# Patient Record
Sex: Male | Born: 1960 | Race: White | Hispanic: No | Marital: Married | Smoking: Never smoker
Health system: Southern US, Community
[De-identification: ages and names within clinical notes are randomized; demographics above are authoritative.]

---

## 2015-07-29 ENCOUNTER — Emergency Department (HOSPITAL_BASED_OUTPATIENT_CLINIC_OR_DEPARTMENT_OTHER): Payer: Self-pay

## 2015-07-29 ENCOUNTER — Emergency Department (HOSPITAL_BASED_OUTPATIENT_CLINIC_OR_DEPARTMENT_OTHER)
Admission: EM | Admit: 2015-07-29 | Discharge: 2015-07-29 | Disposition: A | Discharge status: Home / Self Care | Payer: Self-pay | Attending: Emergency Medicine | Admitting: Emergency Medicine

## 2015-07-29 ENCOUNTER — Encounter (HOSPITAL_BASED_OUTPATIENT_CLINIC_OR_DEPARTMENT_OTHER): Payer: Self-pay | Admitting: *Deleted

## 2015-07-29 DIAGNOSIS — S6992XA Unspecified injury of left wrist, hand and finger(s), initial encounter: Secondary | ICD-10-CM | POA: Insufficient documentation

## 2015-07-29 DIAGNOSIS — Y9289 Other specified places as the place of occurrence of the external cause: Secondary | ICD-10-CM | POA: Insufficient documentation

## 2015-07-29 DIAGNOSIS — Y998 Other external cause status: Secondary | ICD-10-CM | POA: Insufficient documentation

## 2015-07-29 DIAGNOSIS — W228XXA Striking against or struck by other objects, initial encounter: Secondary | ICD-10-CM | POA: Insufficient documentation

## 2015-07-29 DIAGNOSIS — Y9389 Activity, other specified: Secondary | ICD-10-CM | POA: Insufficient documentation

## 2015-07-29 NOTE — ED Notes (Signed)
Pt reports that he injured his left ring finger yesterday.  Swelling and bruising noted.  Pt here to have his ring removed.

## 2015-07-29 NOTE — Discharge Instructions (Signed)
Please use attached information for symptomatic care. Please follow-up with primary care provider if symptoms continue to persist or worsen.
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
## 2016-08-27 IMAGING — CR DG FINGER RING 2+V*L*
3 series · 3 of 3 positions shown · non-contrast
Comparison: None.

CLINICAL DATA: Injury yesterday

EXAM:
LEFT RING FINGER 2+V

[x finger pa left]
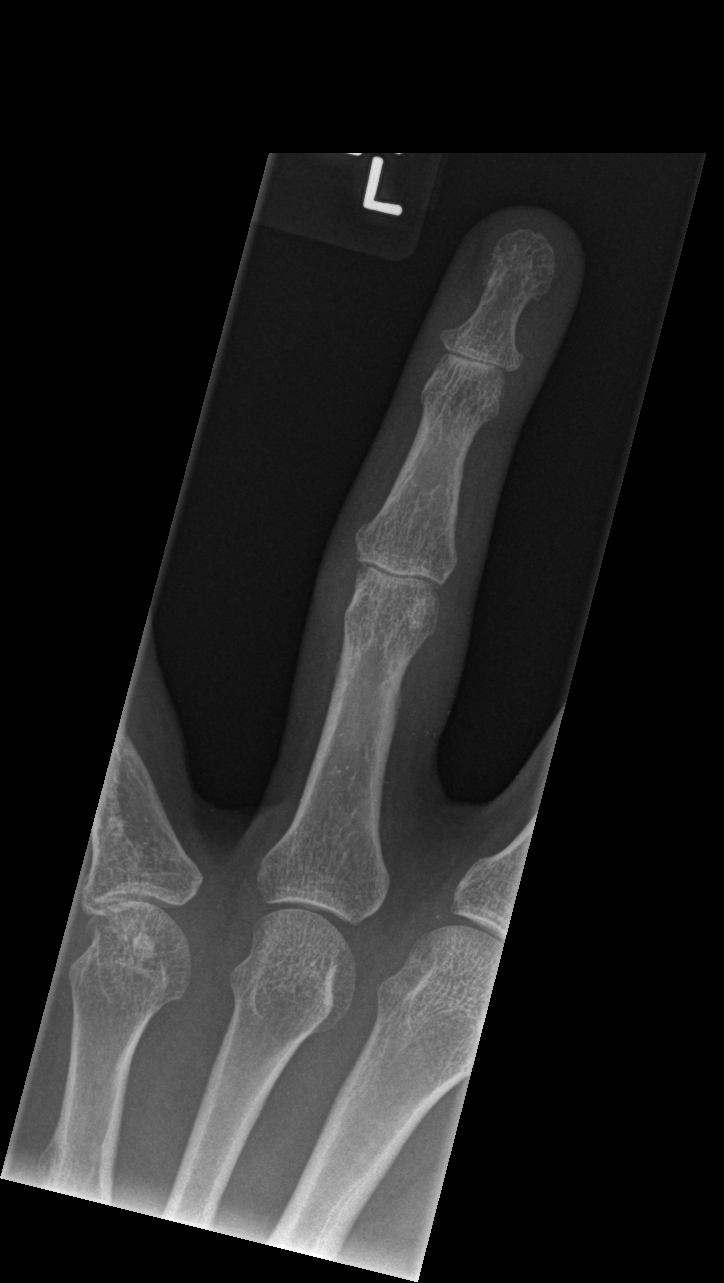

[x finger obl. left]
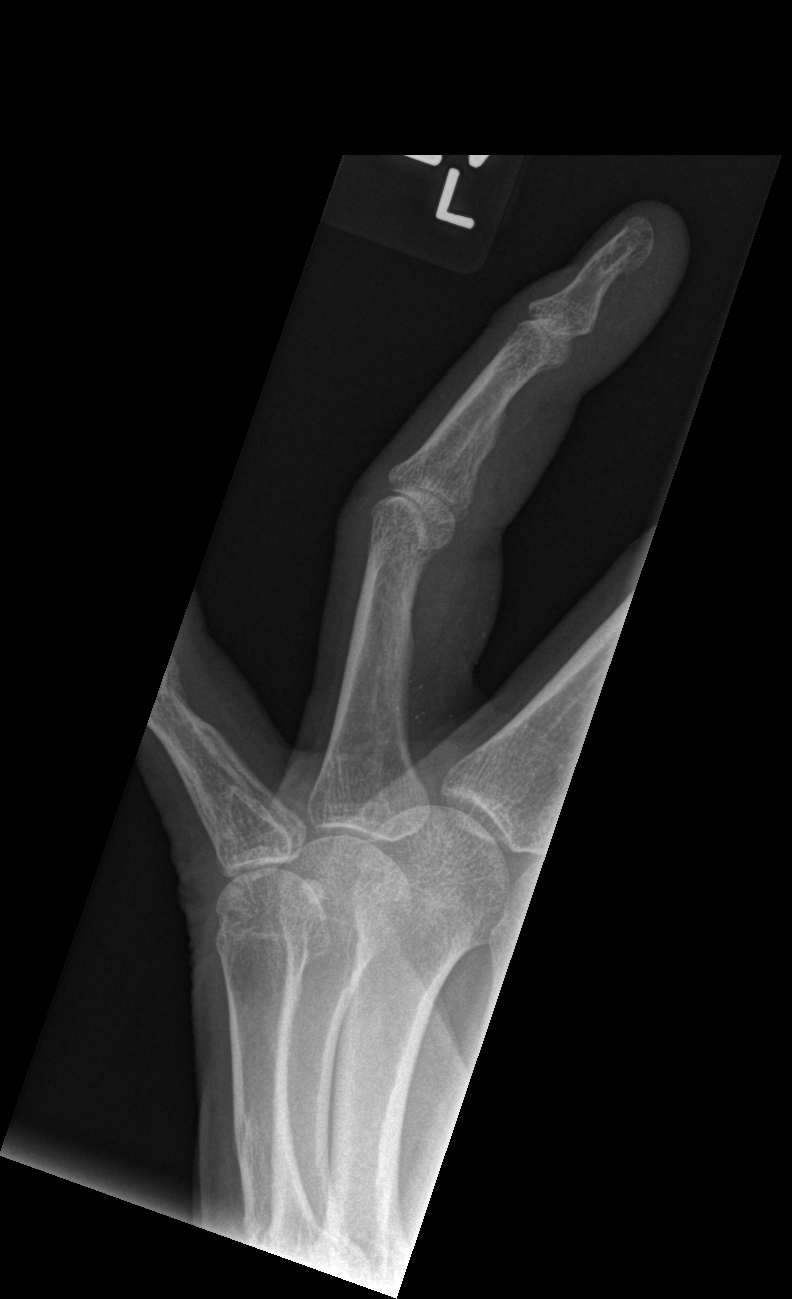

[x finger lateral left]
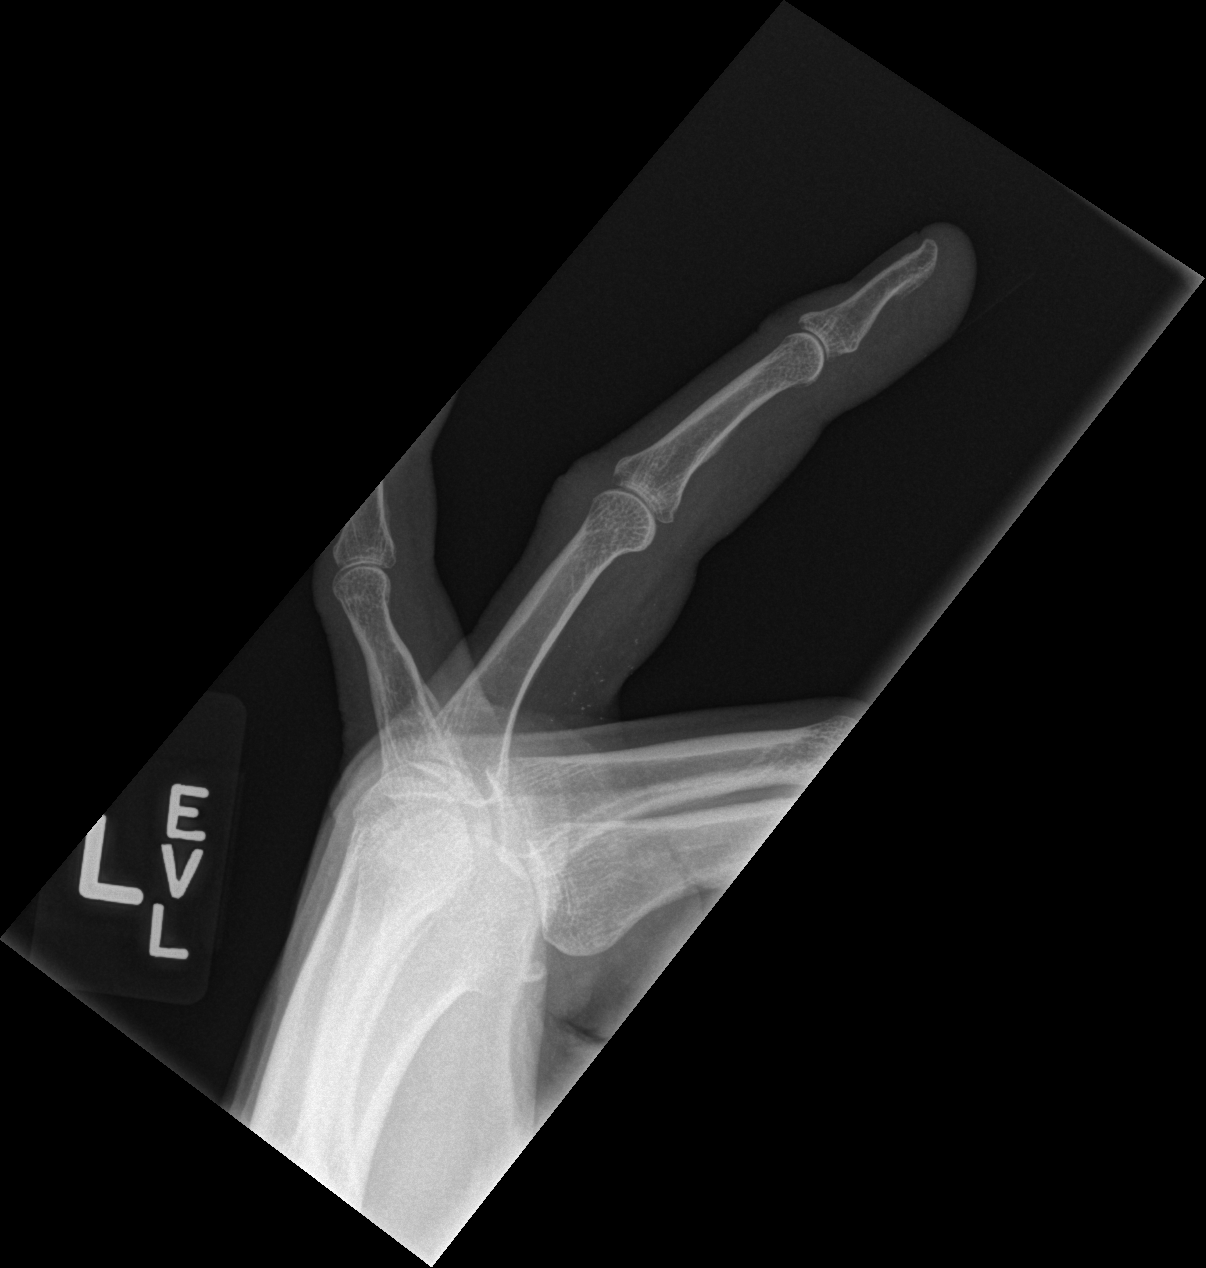

[3 of 3 positions shown; findings below may reference images not displayed]

FINDINGS: No acute fracture. No dislocation. Degenerative changes in the IP
joints are noted.
IMPRESSION: Negative.
